# Patient Record
Sex: Female | Born: 1965 | Race: White | Hispanic: No | Marital: Married | Smoking: Current every day smoker
Health system: Southern US, Community
[De-identification: ages and names within clinical notes are randomized; demographics above are authoritative.]

## PROBLEM LIST (undated history)

## (undated) HISTORY — PX: MOHS SURGERY: SUR867

---

## 1998-07-07 ENCOUNTER — Inpatient Hospital Stay (HOSPITAL_COMMUNITY): Admission: AD | Admit: 1998-07-07 | Discharge: 1998-07-07 | Payer: Self-pay | Admitting: Obstetrics and Gynecology

## 1998-07-17 ENCOUNTER — Inpatient Hospital Stay (HOSPITAL_COMMUNITY): Admission: AD | Admit: 1998-07-17 | Discharge: 1998-08-13 | Payer: Self-pay | Admitting: Obstetrics and Gynecology

## 1998-07-17 ENCOUNTER — Encounter: Payer: Self-pay | Admitting: Obstetrics and Gynecology

## 1998-07-18 ENCOUNTER — Encounter: Payer: Self-pay | Admitting: Obstetrics and Gynecology

## 1998-07-20 ENCOUNTER — Encounter: Payer: Self-pay | Admitting: Obstetrics and Gynecology

## 1998-07-26 ENCOUNTER — Encounter: Payer: Self-pay | Admitting: Obstetrics and Gynecology

## 1998-07-29 ENCOUNTER — Encounter: Payer: Self-pay | Admitting: Obstetrics and Gynecology

## 1998-08-02 ENCOUNTER — Encounter: Payer: Self-pay | Admitting: Obstetrics and Gynecology

## 1998-09-02 ENCOUNTER — Inpatient Hospital Stay (HOSPITAL_COMMUNITY): Admission: AD | Admit: 1998-09-02 | Discharge: 1998-09-02 | Payer: Self-pay | Admitting: Obstetrics and Gynecology

## 1998-09-12 ENCOUNTER — Inpatient Hospital Stay (HOSPITAL_COMMUNITY): Admission: AD | Admit: 1998-09-12 | Discharge: 1998-09-12 | Payer: Self-pay | Admitting: Obstetrics and Gynecology

## 1998-09-26 ENCOUNTER — Encounter (HOSPITAL_COMMUNITY): Admission: RE | Admit: 1998-09-26 | Discharge: 1998-10-21 | Payer: Self-pay | Admitting: Obstetrics and Gynecology

## 1998-09-26 ENCOUNTER — Inpatient Hospital Stay (HOSPITAL_COMMUNITY): Admission: AD | Admit: 1998-09-26 | Discharge: 1998-09-28 | Payer: Self-pay | Admitting: Obstetrics and Gynecology

## 1998-10-05 ENCOUNTER — Inpatient Hospital Stay (HOSPITAL_COMMUNITY): Admission: AD | Admit: 1998-10-05 | Discharge: 1998-10-05 | Payer: Self-pay | Admitting: Obstetrics and Gynecology

## 1998-10-08 ENCOUNTER — Encounter: Payer: Self-pay | Admitting: Obstetrics and Gynecology

## 1998-10-16 ENCOUNTER — Inpatient Hospital Stay (HOSPITAL_COMMUNITY): Admission: AD | Admit: 1998-10-16 | Discharge: 1998-10-16 | Payer: Self-pay | Admitting: Obstetrics and Gynecology

## 1998-10-17 ENCOUNTER — Inpatient Hospital Stay (HOSPITAL_COMMUNITY): Admission: AD | Admit: 1998-10-17 | Discharge: 1998-10-21 | Payer: Self-pay | Admitting: Obstetrics and Gynecology

## 2000-01-07 ENCOUNTER — Other Ambulatory Visit: Admission: RE | Admit: 2000-01-07 | Discharge: 2000-01-07 | Payer: Self-pay | Admitting: Obstetrics and Gynecology

## 2002-03-20 ENCOUNTER — Other Ambulatory Visit: Admission: RE | Admit: 2002-03-20 | Discharge: 2002-03-20 | Payer: Self-pay | Admitting: Obstetrics and Gynecology

## 2002-11-05 ENCOUNTER — Inpatient Hospital Stay (HOSPITAL_COMMUNITY): Admission: AD | Admit: 2002-11-05 | Discharge: 2002-11-05 | Payer: Self-pay | Admitting: Obstetrics and Gynecology

## 2002-11-18 ENCOUNTER — Inpatient Hospital Stay (HOSPITAL_COMMUNITY): Admission: AD | Admit: 2002-11-18 | Discharge: 2002-11-18 | Payer: Self-pay | Admitting: Obstetrics and Gynecology

## 2002-12-29 ENCOUNTER — Ambulatory Visit (HOSPITAL_COMMUNITY): Admission: RE | Admit: 2002-12-29 | Discharge: 2002-12-29 | Payer: Self-pay | Admitting: Obstetrics and Gynecology

## 2003-01-23 ENCOUNTER — Inpatient Hospital Stay (HOSPITAL_COMMUNITY): Admission: AD | Admit: 2003-01-23 | Discharge: 2003-01-23 | Payer: Self-pay | Admitting: Obstetrics and Gynecology

## 2003-03-07 ENCOUNTER — Inpatient Hospital Stay (HOSPITAL_COMMUNITY): Admission: AD | Admit: 2003-03-07 | Discharge: 2003-03-07 | Payer: Self-pay | Admitting: Obstetrics and Gynecology

## 2003-03-08 ENCOUNTER — Inpatient Hospital Stay (HOSPITAL_COMMUNITY): Admission: AD | Admit: 2003-03-08 | Discharge: 2003-03-10 | Payer: Self-pay | Admitting: Obstetrics and Gynecology

## 2003-03-13 ENCOUNTER — Emergency Department (HOSPITAL_COMMUNITY): Admission: EM | Admit: 2003-03-13 | Discharge: 2003-03-14 | Payer: Self-pay | Admitting: Emergency Medicine

## 2003-03-15 ENCOUNTER — Encounter: Admission: RE | Admit: 2003-03-15 | Discharge: 2003-04-14 | Payer: Self-pay | Admitting: Obstetrics and Gynecology

## 2003-04-12 ENCOUNTER — Other Ambulatory Visit: Admission: RE | Admit: 2003-04-12 | Discharge: 2003-04-12 | Payer: Self-pay | Admitting: Obstetrics and Gynecology

## 2004-12-31 ENCOUNTER — Encounter: Admission: RE | Admit: 2004-12-31 | Discharge: 2004-12-31 | Payer: Self-pay | Admitting: Family Medicine

## 2005-01-14 ENCOUNTER — Other Ambulatory Visit: Admission: RE | Admit: 2005-01-14 | Discharge: 2005-01-14 | Payer: Self-pay | Admitting: Obstetrics and Gynecology

## 2014-06-14 ENCOUNTER — Other Ambulatory Visit: Payer: Self-pay | Admitting: Obstetrics and Gynecology

## 2014-06-14 DIAGNOSIS — R928 Other abnormal and inconclusive findings on diagnostic imaging of breast: Secondary | ICD-10-CM

## 2014-06-20 ENCOUNTER — Ambulatory Visit
Admission: RE | Admit: 2014-06-20 | Discharge: 2014-06-20 | Disposition: A | Discharge status: Home / Self Care | Payer: 59 | Source: Ambulatory Visit | Attending: Obstetrics and Gynecology | Admitting: Obstetrics and Gynecology

## 2014-06-20 DIAGNOSIS — R928 Other abnormal and inconclusive findings on diagnostic imaging of breast: Secondary | ICD-10-CM

## 2014-06-21 ENCOUNTER — Other Ambulatory Visit: Payer: Self-pay

## 2015-06-11 ENCOUNTER — Other Ambulatory Visit: Payer: Self-pay | Admitting: Obstetrics and Gynecology

## 2015-06-11 DIAGNOSIS — N63 Unspecified lump in unspecified breast: Secondary | ICD-10-CM

## 2015-06-18 ENCOUNTER — Ambulatory Visit
Admission: RE | Admit: 2015-06-18 | Discharge: 2015-06-18 | Disposition: A | Discharge status: Home / Self Care | Payer: BLUE CROSS/BLUE SHIELD | Source: Ambulatory Visit | Attending: Obstetrics and Gynecology | Admitting: Obstetrics and Gynecology

## 2015-06-18 DIAGNOSIS — N63 Unspecified lump in unspecified breast: Secondary | ICD-10-CM

## 2016-05-13 ENCOUNTER — Other Ambulatory Visit: Payer: Self-pay | Admitting: Obstetrics and Gynecology

## 2016-05-13 DIAGNOSIS — N6489 Other specified disorders of breast: Secondary | ICD-10-CM

## 2016-06-23 ENCOUNTER — Ambulatory Visit
Admission: RE | Admit: 2016-06-23 | Discharge: 2016-06-23 | Disposition: A | Discharge status: Home / Self Care | Payer: BLUE CROSS/BLUE SHIELD | Source: Ambulatory Visit | Attending: Obstetrics and Gynecology | Admitting: Obstetrics and Gynecology

## 2016-06-23 ENCOUNTER — Other Ambulatory Visit: Payer: Self-pay | Admitting: Obstetrics and Gynecology

## 2016-06-23 DIAGNOSIS — N6489 Other specified disorders of breast: Secondary | ICD-10-CM

## 2016-09-03 DIAGNOSIS — Z13220 Encounter for screening for lipoid disorders: Secondary | ICD-10-CM | POA: Diagnosis not present

## 2016-09-03 DIAGNOSIS — Z01419 Encounter for gynecological examination (general) (routine) without abnormal findings: Secondary | ICD-10-CM | POA: Diagnosis not present

## 2016-09-03 DIAGNOSIS — Z131 Encounter for screening for diabetes mellitus: Secondary | ICD-10-CM | POA: Diagnosis not present

## 2016-09-03 DIAGNOSIS — Z6825 Body mass index (BMI) 25.0-25.9, adult: Secondary | ICD-10-CM | POA: Diagnosis not present

## 2016-09-03 DIAGNOSIS — N951 Menopausal and female climacteric states: Secondary | ICD-10-CM | POA: Diagnosis not present

## 2016-10-15 DIAGNOSIS — D125 Benign neoplasm of sigmoid colon: Secondary | ICD-10-CM | POA: Diagnosis not present

## 2016-10-15 DIAGNOSIS — D123 Benign neoplasm of transverse colon: Secondary | ICD-10-CM | POA: Diagnosis not present

## 2016-10-15 DIAGNOSIS — Z1211 Encounter for screening for malignant neoplasm of colon: Secondary | ICD-10-CM | POA: Diagnosis not present

## 2016-10-15 DIAGNOSIS — Z8371 Family history of colonic polyps: Secondary | ICD-10-CM | POA: Diagnosis not present

## 2016-10-15 DIAGNOSIS — D120 Benign neoplasm of cecum: Secondary | ICD-10-CM | POA: Diagnosis not present

## 2016-10-15 DIAGNOSIS — K635 Polyp of colon: Secondary | ICD-10-CM | POA: Diagnosis not present

## 2016-10-16 DIAGNOSIS — D126 Benign neoplasm of colon, unspecified: Secondary | ICD-10-CM | POA: Diagnosis not present

## 2016-10-16 DIAGNOSIS — D122 Benign neoplasm of ascending colon: Secondary | ICD-10-CM | POA: Diagnosis not present

## 2017-10-29 ENCOUNTER — Other Ambulatory Visit: Payer: Self-pay | Admitting: Obstetrics and Gynecology

## 2017-10-29 DIAGNOSIS — N6489 Other specified disorders of breast: Secondary | ICD-10-CM

## 2017-11-25 ENCOUNTER — Ambulatory Visit
Admission: RE | Admit: 2017-11-25 | Discharge: 2017-11-25 | Disposition: A | Discharge status: Home / Self Care | Payer: BLUE CROSS/BLUE SHIELD | Source: Ambulatory Visit | Attending: Obstetrics and Gynecology | Admitting: Obstetrics and Gynecology

## 2017-11-25 DIAGNOSIS — R922 Inconclusive mammogram: Secondary | ICD-10-CM | POA: Diagnosis not present

## 2017-11-25 DIAGNOSIS — N6489 Other specified disorders of breast: Secondary | ICD-10-CM

## 2018-04-14 DIAGNOSIS — Z6825 Body mass index (BMI) 25.0-25.9, adult: Secondary | ICD-10-CM | POA: Diagnosis not present

## 2018-04-14 DIAGNOSIS — Z01419 Encounter for gynecological examination (general) (routine) without abnormal findings: Secondary | ICD-10-CM | POA: Diagnosis not present

## 2018-04-19 ENCOUNTER — Telehealth: Payer: Self-pay | Admitting: Cardiovascular Disease

## 2018-04-19 NOTE — Telephone Encounter (Signed)
Called patient and LVM to call back to schedule an appointment with Dr. Royann Shiversroitoru.

## 2018-06-24 ENCOUNTER — Encounter: Payer: Self-pay | Admitting: Cardiovascular Disease

## 2018-06-24 ENCOUNTER — Ambulatory Visit: Payer: BLUE CROSS/BLUE SHIELD | Admitting: Cardiovascular Disease

## 2018-06-24 ENCOUNTER — Encounter: Payer: Self-pay | Admitting: *Deleted

## 2018-06-24 VITALS — BP 134/80 | HR 75 | Ht 64.0 in | Wt 149.8 lb

## 2018-06-24 DIAGNOSIS — Z8249 Family history of ischemic heart disease and other diseases of the circulatory system: Secondary | ICD-10-CM

## 2018-06-24 NOTE — Patient Instructions (Signed)
Medication Instructions:  NO CHANGE If you need a refill on your cardiac medications before your next appointment, please call your pharmacy.   Lab work: If you have labs (blood work) drawn today and your tests are completely normal, you will receive your results only by: Marland Kitchen MyChart Message (if you have MyChart) OR . A paper copy in the mail If you have any lab test that is abnormal or we need to change your treatment, we will call you to review the results.  Testing/Procedures: Your physician has requested that you have an exercise tolerance test. For further information please visit [email or web address] Please also follow instruction sheet, as given.    Follow-Up: At [facility], you and your health needs are our priority.  As part of our continuing mission to provide you with exceptional heart care, we have created designated Provider Care Teams.  These Care Teams include your primary Cardiologist (physician) and Advanced Practice Providers (APPs -  Physician Assistants and Nurse Practitioners) who all work together to provide you with the care you need, when you need it. You will need a follow up appointment in 12 months.  Please call our office 2 months in advance to schedule this appointment.  You may see  or one of the following Advanced Practice Providers on your designated Care Team: Azalee Course, [place] . Micah Flesher, PA-C

## 2018-06-24 NOTE — Progress Notes (Signed)
Cardiology Office Note:    Date:  06/25/2018   ID:  Theresa Leblanc, DOB 03/27/1966, MRN [id number]  PCP:  Patient, No Pcp Per  Cardiologist:  No primary care provider on file.  Electrophysiologist:  None   Referring MD: Theresa Leblanc, David, MD   Chief Complaint  Patient presents with  . Advice Only    family history premature onset CAD    History of Present Illness:    Theresa Leblanc is a 53 y.o. female with a family hx of early onset coronary artery disease.  Her father died in his 3150s after having had a myocardial infarction, bypass surgery and a defibrillator.  Eventually died of rhythm complications.  Her paternal grandfather also died young from coronary disease.  The patient's mother Theresa Leblanc, was my patient and had a pacemaker for complete heart block. Mrs. 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  Theresa Leblanc has been remarkably healthy, is physically active and does not have any chronic medical illnesses.  Not taking medications he is at most minimally overweight.  She thinks that her cholesterol was around 200, but does not recall having it checked recently.  She quit smoking over 18 years ago and never smoked more than a pack a day.  She has had a basal cell carcinoma removed from the left side of her nose via Mohs surgery.  She has had a C-section, but no other hospitalizations or surgeries.  History reviewed. No pertinent past medical history.  Past Surgical History:  Procedure Laterality Date  . CESAREAN SECTION  2000  . MOHS SURGERY Left    basal cell carcinoma of nose    Current Medications: No outpatient medications have been marked as taking for the 06/24/18 encounter (Office Visit) with Thurmon Fairroitoru, Charletta Voight, MD.     Allergies:   Patient has no known allergies.   Social History   Socioeconomic History  . Marital status: Single    Spouse name: Not on file  . Number of children: Not on file  . Years of education: Not on file  .  Highest education level: Not on file  Occupational History  . Not on file  Social Needs  . Financial resource strain: Not on file  . Food insecurity:    Worry: Not on file    Inability: Not on file  . Transportation needs:    Medical: Not on file    Non-medical: Not on file  Tobacco Use  . Smoking status: Current Every Day Smoker  . Smokeless tobacco: Never Used  Substance and Sexual Activity  . Alcohol use: Not on file  . Drug use: Not on file  . Sexual activity: Not on file  Lifestyle  . Physical activity:    Days per week: Not on file    Minutes per session: Not on file  . Stress: Not on file  Relationships  . Social connections:    Talks on phone: Not on file    Gets together: Not on file    Attends religious service: Not on file    Active member of club or organization: Not on file    Attends meetings of clubs or organizations: Not on file    Relationship status: Not on file  Other Topics Concern  . Not on file  Social History Narrative  . Not on file     Family History: The patient's family history includes Arrhythmia in her mother; Heart attack in her father. There is  no history of Breast cancer.  ROS:   Please see the history of present illness.     All other systems reviewed and are negative.  EKGs/Labs/Other Studies Reviewed:    The following studies were reviewed today: Notes from Dr. Rana Snare, who is her gynecologist and also serves as her primary care provider.  EKG:  EKG is ordered today.  The ekg ordered today demonstrates normal sinus rhythm with very minor nonspecific ST segment changes most obvious in leads III, normal QTC 437 ms  Recent Labs: No results found for requested labs within last 8760 hours.  Recent Lipid Panel No results found for: CHOL, TRIG, HDL, CHOLHDL, VLDL, LDLCALC, LDLDIRECT  Physical Exam:    VS:  BP 134/80 (BP Location: Right Arm)   Pulse 75   Ht 5\' 4"  (1.626 m)   Wt 149 lb 12.8 oz (67.9 kg)   BMI 25.71 kg/m     Wt  Readings from Last 3 Encounters:  06/24/18 149 lb 12.8 oz (67.9 kg)     GEN: Well nourished, well developed in no acute distress HEENT: Normal NECK: No JVD; No carotid bruits LYMPHATICS: No lymphadenopathy CARDIAC: RRR, no murmurs, rubs, gallops RESPIRATORY:  Clear to auscultation without rales, wheezing or rhonchi  ABDOMEN: Soft, non-tender, non-distended MUSCULOSKELETAL:  No edema; No deformity  SKIN: Warm and dry NEUROLOGIC:  Alert and oriented x 3 PSYCHIATRIC:  Normal affect   ASSESSMENT:    1. Family hx of ischem heart dis and oth dis of the circ sys    PLAN:    In order of problems listed above:  1. Family history of early onset CAD: Mrs. Bartus appears to be healthy, is completely asymptomatic and has very limited coronary risk factors (pretty much limited to her family history).  We will schedule her for a plain treadmill ECG stress test.  Also try to retrieve her most recent lipid profile.   Medication Adjustments/Labs and Tests Ordered: Current medicines are reviewed at length with the patient today.  Concerns regarding medicines are outlined above.  Orders Placed This Encounter  Procedures  . Exercise Tolerance Test  . EKG 12-Lead   No orders of the defined types were placed in this encounter.   Patient Instructions  Medication Instructions:  NO CHANGE If you need a refill on your cardiac medications before your next appointment, please call your pharmacy.   Lab work: If you have labs (blood work) drawn today and your tests are completely normal, you will receive your results only by: Marland Kitchen MyChart Message (if you have MyChart) OR . A paper copy in the mail If you have any lab test that is abnormal or we need to change your treatment, we will call you to review the results.  Testing/Procedures: Your physician has requested that you have an exercise tolerance test. For further information please visit [email or web address] Please also follow instruction sheet, as  given.    Follow-Up: At [facility], you and your health needs are our priority.  As part of our continuing mission to provide you with exceptional heart care, we have created designated Provider Care Teams.  These Care Teams include your primary Cardiologist (physician) and Advanced Practice Providers (APPs -  Physician Assistants and Nurse Practitioners) who all work together to provide you with the care you need, when you need it. You will need a follow up appointment in 12 months.  Please call our office 2 months in advance to schedule this appointment.  You may see  or  one of the following Advanced Practice Providers on your designated Care Team: Azalee Course, [place] . Micah Flesher, PA-C        Signed, Thurmon Fair, MD  06/25/2018 11:00 AM    [facility]

## 2018-06-25 ENCOUNTER — Encounter: Payer: Self-pay | Admitting: Cardiovascular Disease

## 2018-06-30 ENCOUNTER — Telehealth (HOSPITAL_COMMUNITY): Payer: Self-pay

## 2018-06-30 NOTE — Telephone Encounter (Signed)
Encounter complete. 

## 2018-07-01 ENCOUNTER — Telehealth: Payer: Self-pay | Admitting: *Deleted

## 2018-07-01 DIAGNOSIS — Z8249 Family history of ischemic heart disease and other diseases of the circulatory system: Secondary | ICD-10-CM

## 2018-07-01 NOTE — Telephone Encounter (Addendum)
Spoke with pt, aware of dr croitoru's request. Lab orders mailed to the pt   ----- Message from Thurmon Fair, MD sent at 06/25/2018 11:08 AM EST ----- I have not been able to find a lipid profile anywhere.  Can you please schedule her for fasting lipids, at her convenience?  Thank you MCr

## 2018-07-05 ENCOUNTER — Ambulatory Visit (HOSPITAL_COMMUNITY)
Admission: RE | Admit: 2018-07-05 | Discharge: 2018-07-05 | Disposition: A | Discharge status: Home / Self Care | Payer: BLUE CROSS/BLUE SHIELD | Source: Ambulatory Visit | Attending: Cardiology | Admitting: Cardiology

## 2018-07-05 ENCOUNTER — Encounter (HOSPITAL_COMMUNITY): Payer: Self-pay | Admitting: *Deleted

## 2018-07-05 DIAGNOSIS — Z8249 Family history of ischemic heart disease and other diseases of the circulatory system: Secondary | ICD-10-CM | POA: Diagnosis not present

## 2018-07-05 LAB — EXERCISE TOLERANCE TEST
CSEPED: 10 min
CSEPHR: 102 %
CSEPPHR: 173 {beats}/min
Estimated workload: 12.1 METS
Exercise duration (sec): 16 s
MPHR: 168 {beats}/min
RPE: 17
Rest HR: 77 {beats}/min

## 2018-07-05 NOTE — Progress Notes (Signed)
Dr. Royann Shivers spoke with pt before leaving.

## 2018-07-06 ENCOUNTER — Other Ambulatory Visit: Payer: Self-pay

## 2018-07-06 DIAGNOSIS — R9431 Abnormal electrocardiogram [ECG] [EKG]: Secondary | ICD-10-CM

## 2018-07-06 DIAGNOSIS — Z8249 Family history of ischemic heart disease and other diseases of the circulatory system: Secondary | ICD-10-CM

## 2018-07-13 ENCOUNTER — Telehealth: Payer: Self-pay

## 2018-07-13 NOTE — Telephone Encounter (Signed)
Spoke to patient I wanted you to know Iam waiting on your appointment to be scheduled for Coronary CT.Advised when I receive appointment I will call you to have you come to office to pick up your instructions and have a bmet on same day.

## 2018-07-15 ENCOUNTER — Telehealth: Payer: Self-pay

## 2018-07-15 DIAGNOSIS — R9431 Abnormal electrocardiogram [ECG] [EKG]: Secondary | ICD-10-CM

## 2018-07-15 DIAGNOSIS — Z8249 Family history of ischemic heart disease and other diseases of the circulatory system: Secondary | ICD-10-CM

## 2018-07-15 NOTE — Telephone Encounter (Signed)
Spoke to patient cardiac ct scheduled 07/26/18 at 11:30 am arrive at 11:00 am at new main entrance of [place].Patient will pick up instructions and have bmet 07/21/18.She requested to have lipid panel done too.

## 2018-07-15 NOTE — Telephone Encounter (Signed)
Please arrive at the [facility] main entrance of [facility] at 11:00 AM (30-45 minutes prior to test start time)  [facility] [address] [place], [place] [address] [phone]  Proceed to the [facility] Radiology Department (First Floor).  Please follow these instructions carefully (unless otherwise directed):    On the Night Before the Test: . Be sure to Drink plenty of water. . Do not consume any caffeinated/decaffeinated beverages or chocolate 12 hours prior to your test. . Do not take any antihistamines 12 hours prior to your test.    On the Day of the Test: . Drink plenty of water. Do not drink any water within one hour of the test. . Do not eat any food 4 hours prior to the test. . You may take your regular medications prior to the test.  . Take metoprolol (Lopressor) 100 mg two hours prior to test.          After the Test: . Drink plenty of water. . After receiving IV contrast, you may experience a mild flushed feeling. This is normal. . On occasion, you may experience a mild rash up to 24 hours after the test. This is not dangerous. If this occurs, you can take Benadryl 25 mg and increase your fluid intake. . If you experience trouble breathing, this can be serious. If it is severe call 911 IMMEDIATELY. If it is mild, please call our office. . If you take any of these medications: Glipizide/Metformin, Avandament, Glucavance, please do not take 48 hours after completing test.

## 2018-07-21 ENCOUNTER — Other Ambulatory Visit: Payer: Self-pay

## 2018-07-21 DIAGNOSIS — Z8249 Family history of ischemic heart disease and other diseases of the circulatory system: Secondary | ICD-10-CM | POA: Diagnosis not present

## 2018-07-21 DIAGNOSIS — R9431 Abnormal electrocardiogram [ECG] [EKG]: Secondary | ICD-10-CM | POA: Diagnosis not present

## 2018-07-21 LAB — LIPID PANEL
CHOL/HDL RATIO: 3.6 ratio (ref 0.0–4.4)
Cholesterol, Total: 192 mg/dL (ref 100–199)
HDL: 53 mg/dL (ref >39)
LDL Calculated: 105 mg/dL — ABNORMAL HIGH (ref 0–99)
TRIGLYCERIDES: 171 mg/dL — AB (ref 0–149)
VLDL Cholesterol Cal: 34 mg/dL (ref 5–40)

## 2018-07-21 LAB — COMPREHENSIVE METABOLIC PANEL
ALBUMIN: 4.5 g/dL (ref 3.8–4.9)
ALT: 21 IU/L (ref 0–32)
AST: 19 IU/L (ref 0–40)
Albumin/Globulin Ratio: 2 (ref 1.2–2.2)
Alkaline Phosphatase: 42 IU/L (ref 39–117)
BUN/Creatinine Ratio: 19 (ref 9–23)
BUN: 11 mg/dL (ref 6–24)
Bilirubin Total: 0.3 mg/dL (ref 0.0–1.2)
CALCIUM: 9 mg/dL (ref 8.7–10.2)
CO2: 19 mmol/L — ABNORMAL LOW (ref 20–29)
Chloride: 105 mmol/L (ref 96–106)
Creatinine, Ser: 0.58 mg/dL (ref 0.57–1.00)
GFR, EST AFRICAN AMERICAN: 122 mL/min/{1.73_m2} (ref >59)
GFR, EST NON AFRICAN AMERICAN: 106 mL/min/{1.73_m2} (ref >59)
Globulin, Total: 2.2 g/dL (ref 1.5–4.5)
Glucose: 80 mg/dL (ref 65–99)
Potassium: 4.2 mmol/L (ref 3.5–5.2)
Sodium: 141 mmol/L (ref 134–144)
TOTAL PROTEIN: 6.7 g/dL (ref 6.0–8.5)

## 2018-07-21 MED ORDER — METOPROLOL TARTRATE 100 MG PO TABS: ORAL_TABLET | ORAL | 0 refills | Status: DC

## 2018-07-25 ENCOUNTER — Other Ambulatory Visit: Payer: Self-pay

## 2018-07-25 DIAGNOSIS — E7800 Pure hypercholesterolemia, unspecified: Secondary | ICD-10-CM

## 2018-07-25 MED ORDER — ATORVASTATIN CALCIUM 10 MG PO TABS: 10.0000 mg | ORAL_TABLET | Freq: Every day | ORAL | 6 refills | Status: DC

## 2018-07-26 ENCOUNTER — Ambulatory Visit (HOSPITAL_COMMUNITY): Payer: BLUE CROSS/BLUE SHIELD

## 2018-08-23 ENCOUNTER — Ambulatory Visit (HOSPITAL_COMMUNITY): Payer: BLUE CROSS/BLUE SHIELD

## 2018-09-05 ENCOUNTER — Telehealth (HOSPITAL_COMMUNITY): Payer: Self-pay | Admitting: Emergency Medicine

## 2018-09-05 NOTE — Telephone Encounter (Signed)
Reaching out to patient to offer assistance regarding upcoming cardiac imaging study; pt verbalizes understanding of appt date/time, parking situation and where to check in, pre-test NPO status and medications ordered, and verified current allergies; name and call back number provided for further questions should they arise Rockwell Alexandria RN Navigator Cardiac Imaging [facility] [phone] office [phone] cell  Pt denies covid 19 symptoms currently

## 2018-09-06 ENCOUNTER — Encounter (HOSPITAL_COMMUNITY): Payer: Self-pay

## 2018-09-06 ENCOUNTER — Other Ambulatory Visit: Payer: Self-pay

## 2018-09-06 ENCOUNTER — Ambulatory Visit (HOSPITAL_COMMUNITY)
Admission: RE | Admit: 2018-09-06 | Discharge: 2018-09-06 | Disposition: A | Discharge status: Home / Self Care | Payer: BLUE CROSS/BLUE SHIELD | Source: Ambulatory Visit | Attending: Cardiovascular Disease | Admitting: Cardiovascular Disease

## 2018-09-06 ENCOUNTER — Telehealth: Payer: Self-pay | Admitting: *Deleted

## 2018-09-06 DIAGNOSIS — Z8249 Family history of ischemic heart disease and other diseases of the circulatory system: Secondary | ICD-10-CM

## 2018-09-06 DIAGNOSIS — R9431 Abnormal electrocardiogram [ECG] [EKG]: Secondary | ICD-10-CM | POA: Insufficient documentation

## 2018-09-06 MED ORDER — IOHEXOL 350 MG/ML SOLN
80.0000 mL | Freq: Once | INTRAVENOUS | Status: AC | PRN
  Administered 2018-09-06: 80 mL via INTRAVENOUS

## 2018-09-06 MED ORDER — NITROGLYCERIN 0.4 MG SL SUBL
0.8000 mg | SUBLINGUAL_TABLET | Freq: Once | SUBLINGUAL | Status: AC
  Administered 2018-09-06: 0.8 mg via SUBLINGUAL
  Filled 2018-09-06: qty 25

## 2018-09-06 MED ORDER — NITROGLYCERIN 0.4 MG SL SUBL
SUBLINGUAL_TABLET | SUBLINGUAL | Status: AC
  Administered 2018-09-06: 0.8 mg via SUBLINGUAL
  Filled 2018-09-06: qty 2

## 2018-09-06 NOTE — Telephone Encounter (Signed)
Patient made aware of results and verbalized understanding.  

## 2018-09-06 NOTE — Telephone Encounter (Signed)
Follow up  ° ° °Pt is returning call  ° ° °Please call back  °

## 2018-09-06 NOTE — Telephone Encounter (Signed)
Left a message for the patient to call back.  

## 2018-09-06 NOTE — Telephone Encounter (Signed)
-----   Message from Thurmon Fair, MD sent at 09/06/2018  4:13 PM EDT ----- Normal and very reassuring coronary CTA. No significant blockages and calcium score zero which means very good prognosis in the long run

## 2018-09-06 NOTE — Progress Notes (Addendum)
CT heart test complete. Patient denies headache or dizziness. Patient ready to go home.

## 2018-10-28 DIAGNOSIS — E7800 Pure hypercholesterolemia, unspecified: Secondary | ICD-10-CM | POA: Diagnosis not present

## 2018-10-28 LAB — LIPID PANEL
CHOLESTEROL TOTAL: 151 mg/dL (ref 100–199)
Chol/HDL Ratio: 3 ratio (ref 0.0–4.4)
HDL: 51 mg/dL (ref >39)
LDL CALC: 75 mg/dL (ref 0–99)
Triglycerides: 125 mg/dL (ref 0–149)
VLDL CHOLESTEROL CAL: 25 mg/dL (ref 5–40)

## 2018-10-31 ENCOUNTER — Other Ambulatory Visit: Payer: Self-pay | Admitting: *Deleted

## 2018-10-31 MED ORDER — ATORVASTATIN CALCIUM 10 MG PO TABS: 10.0000 mg | ORAL_TABLET | Freq: Every day | ORAL | 1 refills | Status: DC

## 2019-03-24 ENCOUNTER — Other Ambulatory Visit: Payer: Self-pay

## 2019-03-24 DIAGNOSIS — Z20822 Contact with and (suspected) exposure to covid-19: Secondary | ICD-10-CM

## 2019-03-26 LAB — NOVEL CORONAVIRUS, NAA: SARS-CoV-2, NAA: NOT DETECTED

## 2019-04-12 DIAGNOSIS — Z20828 Contact with and (suspected) exposure to other viral communicable diseases: Secondary | ICD-10-CM | POA: Diagnosis not present

## 2019-05-18 DIAGNOSIS — Z6826 Body mass index (BMI) 26.0-26.9, adult: Secondary | ICD-10-CM | POA: Diagnosis not present

## 2019-05-18 DIAGNOSIS — Z01419 Encounter for gynecological examination (general) (routine) without abnormal findings: Secondary | ICD-10-CM | POA: Diagnosis not present

## 2019-05-29 ENCOUNTER — Other Ambulatory Visit: Payer: Self-pay

## 2019-05-29 ENCOUNTER — Telehealth: Payer: Self-pay | Admitting: Cardiovascular Disease

## 2019-05-29 MED ORDER — ATORVASTATIN CALCIUM 10 MG PO TABS: 10.0000 mg | ORAL_TABLET | Freq: Every day | ORAL | 0 refills | Status: DC

## 2019-05-29 NOTE — Telephone Encounter (Signed)
*  STAT* If patient is at the pharmacy, call can be transferred to refill team.   1. Which medications need to be refilled? (please list name of each medication and dose if known) Atorvastatin  . Which pharmacy/location (including street and city if local pharmacy) is medication to be sent to?[facility] - [address], [place]  3. Do they need a 30 day or 90 day supply? 90 days and refills

## 2019-06-14 ENCOUNTER — Other Ambulatory Visit: Payer: Self-pay

## 2019-06-14 ENCOUNTER — Encounter: Payer: Self-pay | Admitting: Cardiovascular Disease

## 2019-06-14 ENCOUNTER — Ambulatory Visit: Payer: BC Managed Care – PPO | Admitting: Cardiovascular Disease

## 2019-06-14 VITALS — BP 120/84 | HR 73 | Ht 64.0 in | Wt 154.0 lb

## 2019-06-14 DIAGNOSIS — E7800 Pure hypercholesterolemia, unspecified: Secondary | ICD-10-CM | POA: Diagnosis not present

## 2019-06-14 DIAGNOSIS — Z8249 Family history of ischemic heart disease and other diseases of the circulatory system: Secondary | ICD-10-CM

## 2019-06-14 DIAGNOSIS — Z789 Other specified health status: Secondary | ICD-10-CM

## 2019-06-14 MED ORDER — ATORVASTATIN CALCIUM 10 MG PO TABS: 10.0000 mg | ORAL_TABLET | Freq: Every day | ORAL | 3 refills | Status: DC

## 2019-06-14 NOTE — Progress Notes (Signed)
Cardiology Office Note:    Date:  06/20/2019   ID:  Theresa Leblanc, DOB 13-Dec-1965, MRN [id number]  PCP:  Theresa Shorten, MD  Cardiologist:  No primary care provider on file.  Electrophysiologist:  None   Referring MD: Theresa Shorten, MD   Chief Complaint  Patient presents with  . Follow-up    Family history CAD  . Hyperlipidemia    History of Present Illness:    Theresa Leblanc is a 54 y.o. female with a family hx of early onset coronary artery disease.  Her father died in his 29s after having had a myocardial infarction, bypass surgery and a defibrillator.  Eventually died of rhythm complications.  Her paternal grandfather also died young from coronary disease.  The patient's mother Theresa Leblanc, was my patient and had a pacemaker for complete heart block. 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  Theresa Leblanc has been remarkably healthy, is physically active and does not have any chronic medical illnesses.  Not taking medications he is at most minimally overweight.  She thinks that her cholesterol was around 200, but does not recall having it checked recently.  She quit smoking over 18 years ago and never smoked more than a pack a day.  She has had a basal cell carcinoma removed from the left side of her nose via Mohs surgery.  She has had a C-section, but no other hospitalizations or surgeries.  Because of her family history, Theresa Leblanc had a treadmill stress test that showed concerning findings.  A follow-up coronary CT angiogram did not show any evidence of coronary disease..  Calcium score was 0. Based on the MESA risk calculator her odds of having a major cardiovascular complication in the next 10 years is only 2% while taking statin therapy.  Lipid profile performed a few months ago showed an LDL cholesterol of 75 and HDL of 51.  No past medical history of serious medical illnesses except mild hyperlipidemia  Past Surgical History:  Procedure  Laterality Date  . CESAREAN SECTION  2000  . MOHS SURGERY Left    basal cell carcinoma of nose    Current Medications: Current Meds  Medication Sig  . atorvastatin (LIPITOR) 10 MG tablet Take 1 tablet (10 mg total) by mouth daily.  . [DISCONTINUED] atorvastatin (LIPITOR) 10 MG tablet Take 1 tablet (10 mg total) by mouth daily.     Allergies:   Patient has no known allergies.   Social History   Socioeconomic History  . Marital status: Married    Spouse name: Not on file  . Number of children: Not on file  . Years of education: Not on file  . Highest education level: Not on file  Occupational History  . Not on file  Tobacco Use  . Smoking status: Current Every Day Smoker  . Smokeless tobacco: Never Used  Substance and Sexual Activity  . Alcohol use: Not on file  . Drug use: Not on file  . Sexual activity: Not on file  Other Topics Concern  . Not on file  Social History Narrative  . Not on file   Social Determinants of Health   Financial Resource Strain:   . Difficulty of Paying Living Expenses: Not on file  Food Insecurity:   . Worried About Charity fundraiser in the Last Year: Not on file  . Ran Out of Food in the Last Year: Not on file  Transportation Needs:   .  Lack of Transportation (Medical): Not on file  . Lack of Transportation (Non-Medical): Not on file  Physical Activity:   . Days of Exercise per Week: Not on file  . Minutes of Exercise per Session: Not on file  Stress:   . Feeling of Stress : Not on file  Social Connections:   . Frequency of Communication with Friends and Family: Not on file  . Frequency of Social Gatherings with Friends and Family: Not on file  . Attends Religious Services: Not on file  . Active Member of Clubs or Organizations: Not on file  . Attends Banker Meetings: Not on file  . Marital Status: Not on file     Family History: The patient's family history includes Arrhythmia in her mother; Heart attack in her  father. There is no history of Breast cancer.  ROS:   Please see the history of present illness.     All other systems reviewed and are negative.  EKGs/Labs/Other Studies Reviewed:    The following studies were reviewed today:  EKG:  EKG is ordered today.  It shows normal sinus rhythm with sinus arrhythmia.  No ischemic changes.  Recent Labs: 07/21/2018: ALT 21; BUN 11; Creatinine, Ser 0.58; Potassium 4.2; Sodium 141  Recent Lipid Panel    Component Value Date/Time   CHOL 151 10/28/2018 0822   TRIG 125 10/28/2018 0822   HDL 51 10/28/2018 0822   CHOLHDL 3.0 10/28/2018 0822   LDLCALC 75 10/28/2018 0822    Physical Exam:    VS:  BP 120/84   Pulse 73   Ht 5\' 4"  (1.626 m)   Wt 154 lb (69.9 kg)   BMI 26.43 kg/m     Wt Readings from Last 3 Encounters:  06/14/19 154 lb (69.9 kg)  06/24/18 149 lb 12.8 oz (67.9 kg)     General: Alert, oriented x3, no distress, appears lean and fit Head: no evidence of trauma, PERRL, EOMI, no exophtalmos or lid lag, no myxedema, no xanthelasma; normal ears, nose and oropharynx Neck: normal jugular venous pulsations and no hepatojugular reflux; brisk carotid pulses without delay and no carotid bruits Chest: clear to auscultation, no signs of consolidation by percussion or palpation, normal fremitus, symmetrical and full respiratory excursions Cardiovascular: normal position and quality of the apical impulse, regular rhythm, normal first and second heart sounds, no murmurs, rubs or gallops Abdomen: no tenderness or distention, no masses by palpation, no abnormal pulsatility or arterial bruits, normal bowel sounds, no hepatosplenomegaly Extremities: no clubbing, cyanosis or edema; 2+ radial, ulnar and brachial pulses bilaterally; 2+ right femoral, posterior tibial and dorsalis pedis pulses; 2+ left femoral, posterior tibial and dorsalis pedis pulses; no subclavian or femoral bruits Neurological: grossly nonfocal Psych: Normal mood and affect    ASSESSMENT:    1. Family history of coronary artery disease in father   2. False positive stress test   3. Hypercholesterolemia    PLAN:    In order of problems listed above:  1. Family history of early onset CAD: Theresa Leblanc appears to be healthy, is completely asymptomatic and has very limited coronary risk factors (pretty much limited to her family history).  We will schedule her for a plain treadmill ECG stress test.  Also try to retrieve her most recent lipid profile. 2. "False positive" stress test: Future functional evaluation should include imaging studies such as stress echo or nuclear scanning to increase specificity. 3. Hypercholesterolemia: Mild, excellent response to statin therapy.  She can follow-up with  Dr. Rana Snare; or I will be glad to see her on a periodic basis to renew her prescription   Medication Adjustments/Labs and Tests Ordered: Current medicines are reviewed at length with the patient today.  Concerns regarding medicines are outlined above.  Orders Placed This Encounter  Procedures  . EKG 12-Lead   Meds ordered this encounter  Medications  . atorvastatin (LIPITOR) 10 MG tablet    Sig: Take 1 tablet (10 mg total) by mouth daily.    Dispense:  90 tablet    Refill:  3    Patient Instructions  Medication Instructions:  No changes *If you need a refill on your cardiac medications before your next appointment, please call your pharmacy*  Lab Work: None ordered If you have labs (blood work) drawn today and your tests are completely normal, you will receive your results only by: Marland Kitchen MyChart Message (if you have MyChart) OR . A paper copy in the mail If you have any lab test that is abnormal or we need to change your treatment, we will call you to review the results.  Testing/Procedures: None ordered  Follow-Up: At [facility], you and your health needs are our priority.  As part of our continuing mission to provide you with exceptional heart care, we have  created designated Provider Care Teams.  These Care Teams include your primary Cardiologist (physician) and Advanced Practice Providers (APPs -  Physician Assistants and Nurse Practitioners) who all work together to provide you with the care you need, when you need it.  Your next appointment:   12 month(s)  The format for your next appointment:   In Person  Provider:        Signed, Thurmon Fair, MD  06/20/2019 8:04 AM    [facility]

## 2019-06-14 NOTE — Patient Instructions (Signed)
Medication Instructions:  No changes *If you need a refill on your cardiac medications before your next appointment, please call your pharmacy*  Lab Work: None ordered If you have labs (blood work) drawn today and your tests are completely normal, you will receive your results only by: Marland Kitchen MyChart Message (if you have MyChart) OR . A paper copy in the mail If you have any lab test that is abnormal or we need to change your treatment, we will call you to review the results.  Testing/Procedures: None ordered  Follow-Up: At [facility], you and your health needs are our priority.  As part of our continuing mission to provide you with exceptional heart care, we have created designated Provider Care Teams.  These Care Teams include your primary Cardiologist (physician) and Advanced Practice Providers (APPs -  Physician Assistants and Nurse Practitioners) who all work together to provide you with the care you need, when you need it.  Your next appointment:   12 month(s)  The format for your next appointment:   In Person  Provider:

## 2019-06-20 ENCOUNTER — Encounter: Payer: Self-pay | Admitting: Cardiovascular Disease

## 2019-09-06 ENCOUNTER — Other Ambulatory Visit: Payer: Self-pay | Admitting: Obstetrics and Gynecology

## 2019-09-06 DIAGNOSIS — Z1231 Encounter for screening mammogram for malignant neoplasm of breast: Secondary | ICD-10-CM

## 2019-10-26 ENCOUNTER — Ambulatory Visit
Admission: RE | Admit: 2019-10-26 | Discharge: 2019-10-26 | Disposition: A | Discharge status: Home / Self Care | Payer: BC Managed Care – PPO | Source: Ambulatory Visit | Attending: Obstetrics and Gynecology | Admitting: Obstetrics and Gynecology

## 2019-10-26 ENCOUNTER — Other Ambulatory Visit: Payer: Self-pay

## 2019-10-26 DIAGNOSIS — Z1231 Encounter for screening mammogram for malignant neoplasm of breast: Secondary | ICD-10-CM

## 2020-02-12 DIAGNOSIS — Z20822 Contact with and (suspected) exposure to covid-19: Secondary | ICD-10-CM | POA: Diagnosis not present

## 2020-09-03 ENCOUNTER — Other Ambulatory Visit: Payer: Self-pay | Admitting: Cardiovascular Disease

## 2020-09-03 NOTE — Telephone Encounter (Signed)
Rx(s) sent to pharmacy electronically.  

## 2020-10-03 ENCOUNTER — Other Ambulatory Visit: Payer: Self-pay | Admitting: Cardiovascular Disease

## 2020-10-29 ENCOUNTER — Other Ambulatory Visit: Payer: Self-pay | Admitting: Cardiovascular Disease

## 2020-11-28 ENCOUNTER — Other Ambulatory Visit: Payer: Self-pay | Admitting: Cardiovascular Disease

## 2020-12-12 ENCOUNTER — Other Ambulatory Visit: Payer: Self-pay | Admitting: Cardiovascular Disease

## 2021-01-30 ENCOUNTER — Other Ambulatory Visit: Payer: Self-pay | Admitting: Cardiovascular Disease

## 2021-08-07 ENCOUNTER — Other Ambulatory Visit: Payer: Self-pay | Admitting: Obstetrics and Gynecology

## 2021-08-07 DIAGNOSIS — Z1231 Encounter for screening mammogram for malignant neoplasm of breast: Secondary | ICD-10-CM

## 2021-08-15 ENCOUNTER — Ambulatory Visit
Admission: RE | Admit: 2021-08-15 | Discharge: 2021-08-15 | Disposition: A | Discharge status: Home / Self Care | Payer: BC Managed Care – PPO | Source: Ambulatory Visit | Attending: Obstetrics and Gynecology | Admitting: Obstetrics and Gynecology

## 2021-08-15 DIAGNOSIS — Z1231 Encounter for screening mammogram for malignant neoplasm of breast: Secondary | ICD-10-CM

## 2022-09-15 ENCOUNTER — Other Ambulatory Visit: Payer: Self-pay | Admitting: Obstetrics and Gynecology

## 2022-09-15 DIAGNOSIS — Z0000 Encounter for general adult medical examination without abnormal findings: Secondary | ICD-10-CM

## 2022-10-05 ENCOUNTER — Ambulatory Visit
Admission: RE | Admit: 2022-10-05 | Discharge: 2022-10-05 | Disposition: A | Discharge status: Home / Self Care | Payer: No Typology Code available for payment source | Source: Ambulatory Visit | Attending: Obstetrics and Gynecology | Admitting: Obstetrics and Gynecology

## 2022-10-05 DIAGNOSIS — Z0000 Encounter for general adult medical examination without abnormal findings: Secondary | ICD-10-CM

## 2023-08-25 ENCOUNTER — Other Ambulatory Visit: Payer: Self-pay | Admitting: Obstetrics and Gynecology

## 2023-08-25 DIAGNOSIS — Z1231 Encounter for screening mammogram for malignant neoplasm of breast: Secondary | ICD-10-CM

## 2023-10-07 ENCOUNTER — Ambulatory Visit

## 2023-10-15 ENCOUNTER — Ambulatory Visit

## 2023-10-15 ENCOUNTER — Ambulatory Visit
Admission: RE | Admit: 2023-10-15 | Discharge: 2023-10-15 | Disposition: A | Discharge status: Home / Self Care | Source: Ambulatory Visit | Attending: Obstetrics and Gynecology | Admitting: Obstetrics and Gynecology

## 2023-10-15 DIAGNOSIS — Z1231 Encounter for screening mammogram for malignant neoplasm of breast: Secondary | ICD-10-CM

## 2023-11-25 IMAGING — MG MM DIGITAL SCREENING BILAT W/ TOMO AND CAD
8 series · 8 of 24 positions shown · non-contrast
Comparison: Previous exam(s).

CLINICAL DATA: Screening.

EXAM:
DIGITAL SCREENING BILATERAL MAMMOGRAM WITH TOMOSYNTHESIS AND CAD
TECHNIQUE: Bilateral screening digital craniocaudal and mediolateral oblique
mammograms were obtained. Bilateral screening digital breast
tomosynthesis was performed. The images were evaluated with
computer-aided detection.

[R MLO synth-2D]
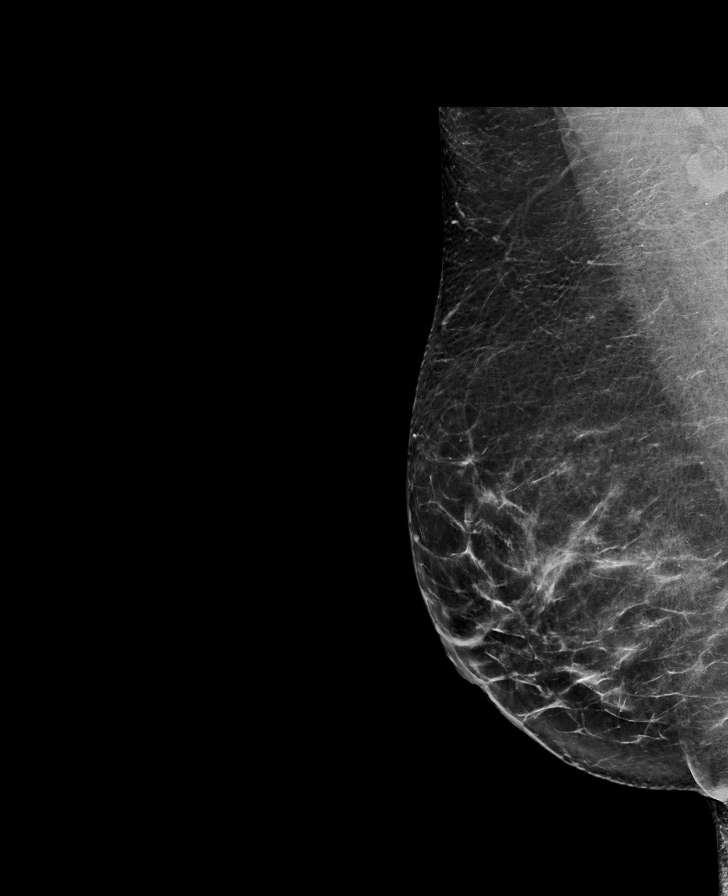

[R CC synth-2D]
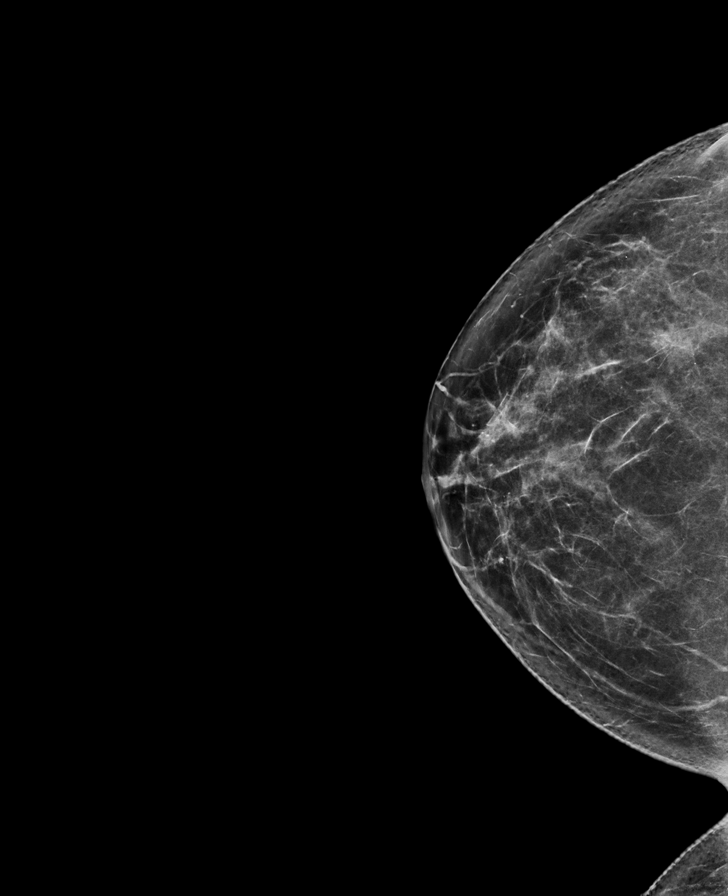

[L MLO synth-2D]
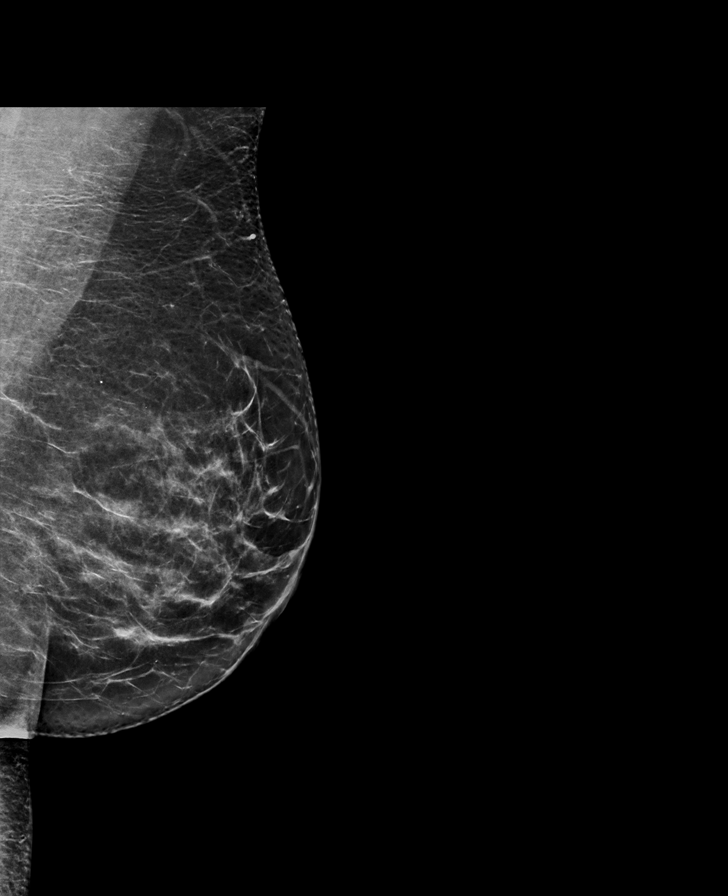

[L CC synth-2D]
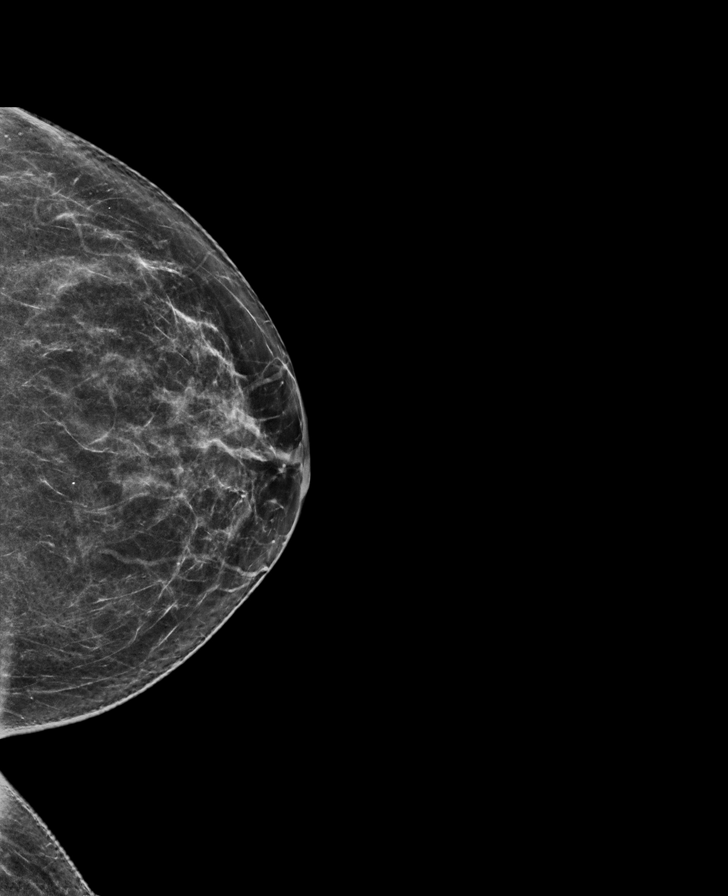

[R MLO tomo · tomo slice 43/85.0]
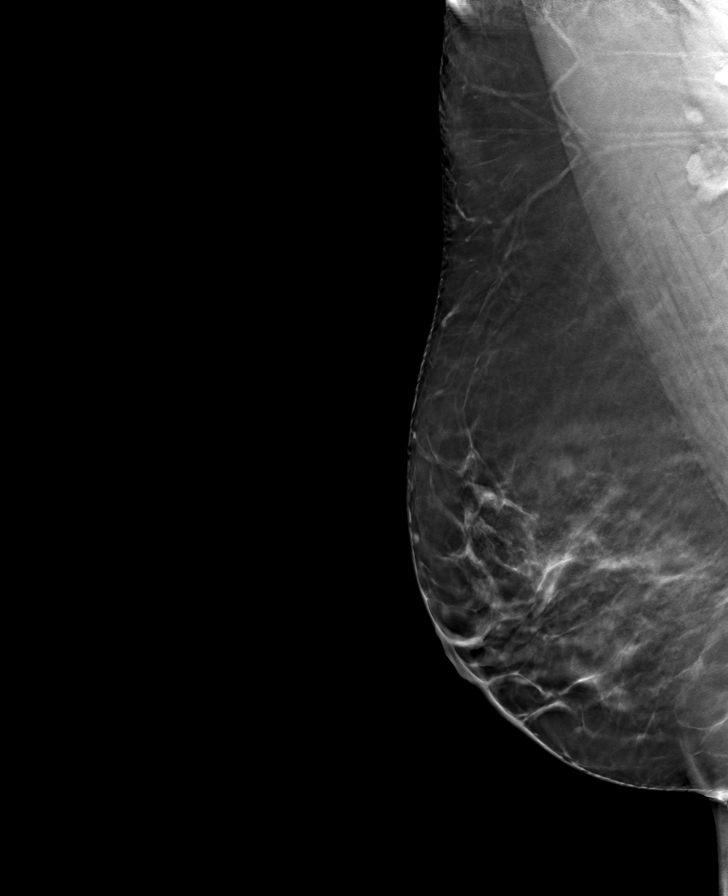

[L CC tomo · tomo slice 37/73.0]
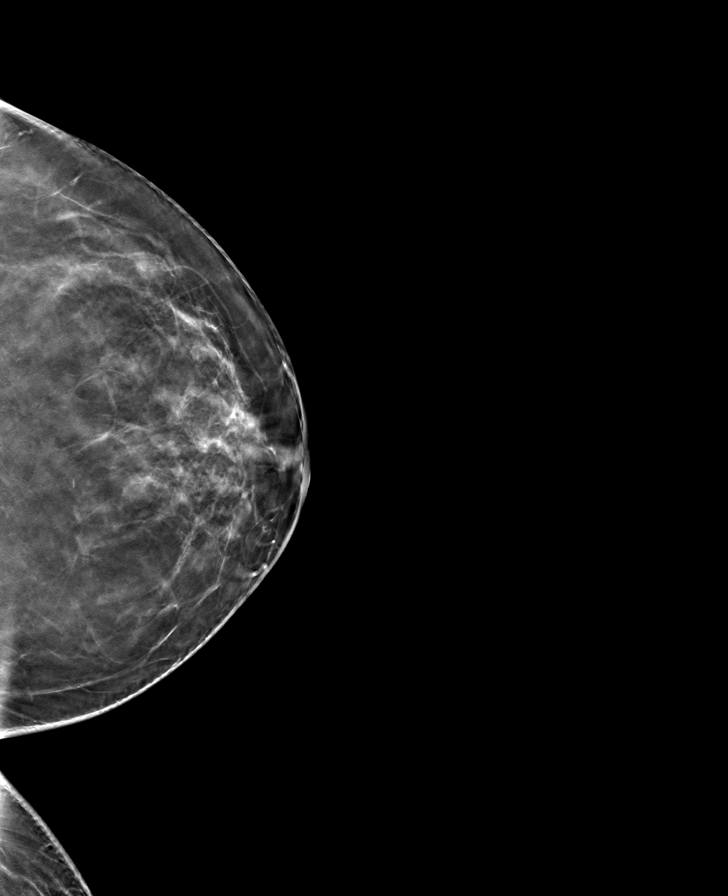

[R CC tomo · tomo slice 39/78.0]
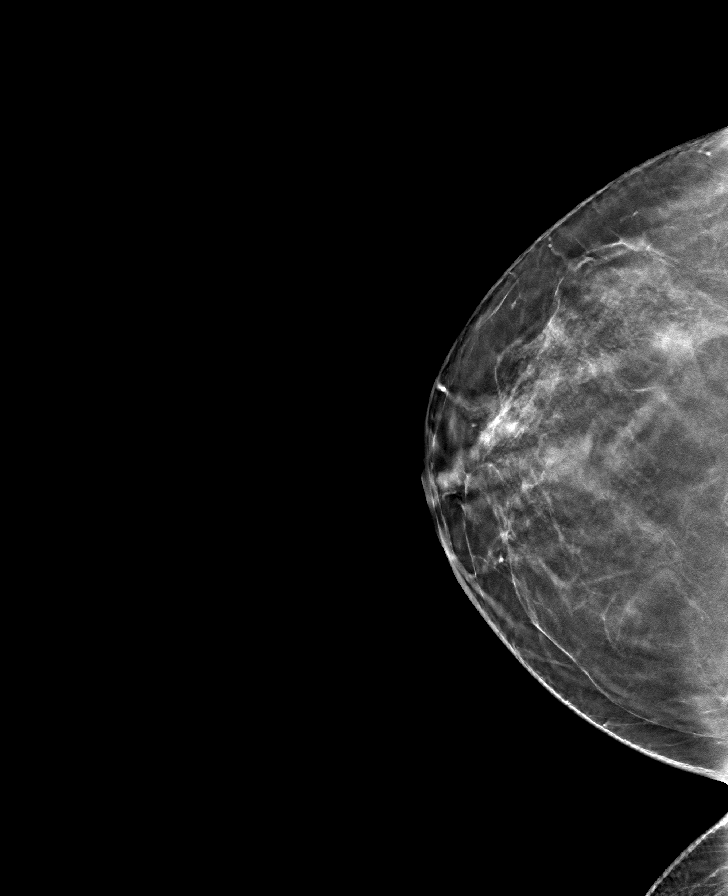

[L MLO tomo · tomo slice 41/80.0]
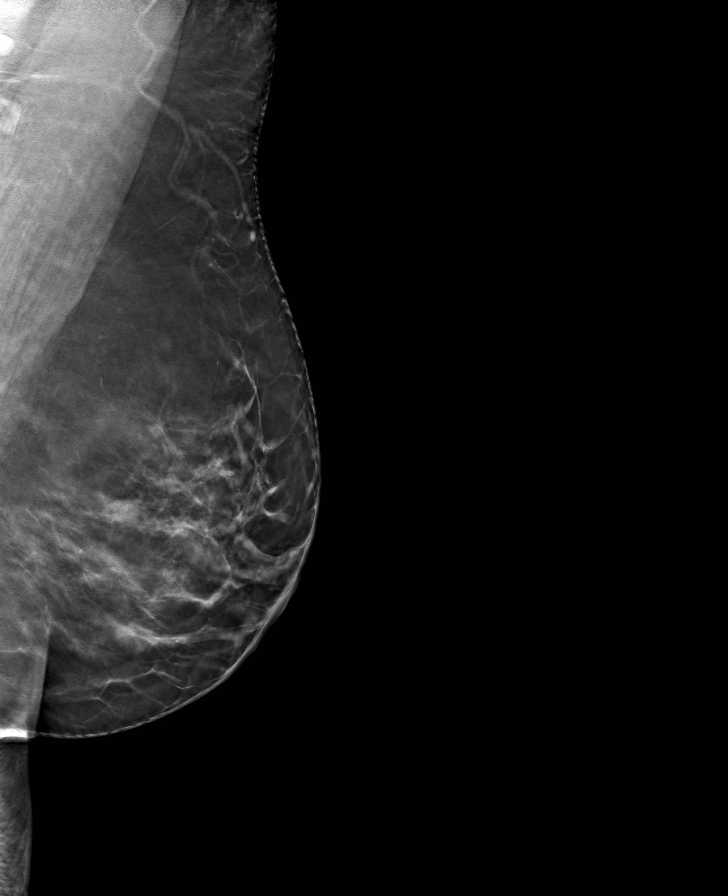

[8 of 24 positions shown; findings below may reference images not displayed]

ACR Breast Density Category b: There are scattered areas of
fibroglandular density.
FINDINGS: There are no findings suspicious for malignancy.
IMPRESSION: No mammographic evidence of malignancy. A result letter of this
screening mammogram will be mailed directly to the patient.

RECOMMENDATION:
Screening mammogram in one year. (Code:51-O-LD2)

BI-RADS CATEGORY  1: Negative.
